# Patient Record
Sex: Male | Born: 1970 | Race: Black or African American | Hispanic: No | Marital: Married | Smoking: Never smoker
Health system: Southern US, Community
[De-identification: ages and names within clinical notes are randomized; demographics above are authoritative.]

## PROBLEM LIST (undated history)

## (undated) DIAGNOSIS — I10 Essential (primary) hypertension: Secondary | ICD-10-CM

## (undated) DIAGNOSIS — E119 Type 2 diabetes mellitus without complications: Secondary | ICD-10-CM

## (undated) DIAGNOSIS — E611 Iron deficiency: Secondary | ICD-10-CM

## (undated) DIAGNOSIS — E7800 Pure hypercholesterolemia, unspecified: Secondary | ICD-10-CM

## (undated) HISTORY — PX: ACHILLES TENDON REPAIR: SUR1153

---

## 1999-05-30 ENCOUNTER — Encounter: Payer: Self-pay | Admitting: Emergency Medicine

## 1999-05-30 ENCOUNTER — Emergency Department (HOSPITAL_COMMUNITY): Admission: EM | Admit: 1999-05-30 | Discharge: 1999-05-30 | Payer: Self-pay | Admitting: Emergency Medicine

## 2011-05-01 ENCOUNTER — Other Ambulatory Visit: Payer: Self-pay | Admitting: Orthopedic Surgery

## 2011-05-01 DIAGNOSIS — M25572 Pain in left ankle and joints of left foot: Secondary | ICD-10-CM

## 2011-05-02 ENCOUNTER — Other Ambulatory Visit: Payer: Self-pay

## 2011-05-02 ENCOUNTER — Ambulatory Visit
Admission: RE | Admit: 2011-05-02 | Discharge: 2011-05-02 | Disposition: A | Discharge status: Home / Self Care | Payer: Managed Care, Other (non HMO) | Source: Ambulatory Visit | Attending: Orthopedic Surgery | Admitting: Orthopedic Surgery

## 2011-05-02 DIAGNOSIS — M25572 Pain in left ankle and joints of left foot: Secondary | ICD-10-CM

## 2011-05-04 ENCOUNTER — Other Ambulatory Visit: Payer: Self-pay

## 2011-06-21 ENCOUNTER — Ambulatory Visit (HOSPITAL_COMMUNITY)
Admission: RE | Admit: 2011-06-21 | Discharge: 2011-06-21 | Disposition: A | Discharge status: Home / Self Care | Payer: Managed Care, Other (non HMO) | Source: Ambulatory Visit | Attending: Orthopedic Surgery | Admitting: Orthopedic Surgery

## 2011-06-21 DIAGNOSIS — M25673 Stiffness of unspecified ankle, not elsewhere classified: Secondary | ICD-10-CM | POA: Insufficient documentation

## 2011-06-21 DIAGNOSIS — M25676 Stiffness of unspecified foot, not elsewhere classified: Secondary | ICD-10-CM | POA: Insufficient documentation

## 2011-06-21 DIAGNOSIS — M25879 Other specified joint disorders, unspecified ankle and foot: Secondary | ICD-10-CM | POA: Insufficient documentation

## 2011-06-21 DIAGNOSIS — M6281 Muscle weakness (generalized): Secondary | ICD-10-CM | POA: Insufficient documentation

## 2011-06-21 DIAGNOSIS — R262 Difficulty in walking, not elsewhere classified: Secondary | ICD-10-CM | POA: Insufficient documentation

## 2011-06-21 DIAGNOSIS — IMO0001 Reserved for inherently not codable concepts without codable children: Secondary | ICD-10-CM | POA: Insufficient documentation

## 2011-06-21 DIAGNOSIS — M25579 Pain in unspecified ankle and joints of unspecified foot: Secondary | ICD-10-CM | POA: Insufficient documentation

## 2011-06-21 NOTE — Evaluation (Signed)
Physical Therapy Evaluation  Patient Details  Name: Zachary Perry MRN: [id number] Date of Birth: 07-04-1970  Today's Date: 06/21/2011 Time: 0803-0850 PT Time Calculation (min): 47 min  Visit#: 1  of 8   Re-eval: 07/21/11 Assessment Diagnosis: achilles tendon repair Surgical Date: 05/08/11 Next MD Visit: 07/30/2011 Prior Therapy: none  Authorization: [employer]  Subjective Symptoms/Limitations Symptoms: [name] states that he was playing basketball in April and went to jump when he came down he could not walk correctly.  He continued to work on the ankle for two weeks but his ankle was very swollen.  He went to urgent care who told him to use cruthces for two weeks but a week after he decided to go see an orthopedic surgeon who ordered an MRI that showed a torn tendon.  He was operated  May 7th.  He was casted  for six weeks and then placed in a cam boot.  He was to use crutches for one to two weeks but he states he has not needed to use the crutches yet.  The patient is currently being referred to therapy to improve his ROM and strentgh and return him to prior functional level. How long can you stand comfortably?: No problem  How long can you walk comfortably?: The longest he has walked with his cam boot on it 45 minutes. Patient Stated Goals: To be able to do everything he wants to do. Pain Assessment Currently in Pain?: No/denies  Precautions/Restrictions   Pt is in a cam boot.   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#, pushing a box on rollers that weighs 400# Leisure: Hobbies-yes (Comment) Comments: basketball, preaches;bowling, golfing   Cognition/Observation Cognition Overall Cognitive Status: Appears within  functional limits for tasks assessed  Sensation/Coordination/Flexibility/Functional Tests Functional Tests Functional Tests: LEFS 44/80  Assessment LLE AROM (degrees) Left Ankle Dorsiflexion: 8  Left Ankle Plantar Flexion: 42  Left Ankle Inversion: 21  Left Ankle Eversion: 10  LLE Strength Left Ankle Dorsiflexion: 4/5 Left Ankle Plantar Flexion: 4/5 Left Ankle Inversion: 4/5 Left Ankle Eversion: 4/5  Exercise/Treatments   Towel Crunch: 3 reps Ankle Exercises - Supine T-Band:  (blue t-band x 15 for all) Ankle Exercises - Sidelying    Physical Therapy Assessment and Plan PT Assessment and Plan Clinical Impression Statement: Pt s/p achilles tendon repair x 6 weeks who will benefit from skilled PT to improve proprioception, and functinal strength to allow pt to return to work and recreational [employer], mini-squat, gastroc stretch, rocker board and sitting baps for next treatment.  Third treatment add SLS, B heel raises, progress to two feet standing baps  insuring to increased pain.   PA called back verbalized that the patient may be out of the Cam boot while in therapy or in the house for short times only for the next two weeks.  After this the patient can begin weaning out of boot outside of the home as well. Goals Home Exercise Program Pt will Perform Home Exercise Program: Independently PT Short Term Goals Time to Complete Short Term Goals: 2 weeks PT Short Term Goal 1: Pt to ambulate without cam boot in the house comfortably. PT Short Term Goal 2: Pt strength improved by 1/2 grade. PT Long Term Goals Time to Complete Long Term  Goals: 4 weeks PT Long Term Goal 1: Pt to be able to be out of cam boot all day without increased pain PT Long Term Goal 2: Pt to be able to go up and down steps reciprocally Long Term Goal 3: Pt to be able to squat and lift 40# box without  difficulty Long Term Goal 4: Pt to be able to RTW PT Long Term Goal 5: Pt to be able to SLS for  60 seconds  Additional PT Long Term Goals?: Yes PT Long Term Goal 6: Pt to be able to bowl without increased pain  Problem List Patient Active Problem List  Diagnosis  . Decreased proprioception of joint of foot    PT - End of Session Activity Tolerance: Patient tolerated treatment well General Behavior During Session: [facility] for tasks performed Cognition: [facility] for tasks performed  [name],CINDY 06/21/2011, 9:27 AM  Physician Documentation Your signature is required to indicate approval of the treatment plan as stated above.  Please sign and either send electronically or make a copy of this report for your files and return this physician signed original.   Please mark one 1.__approve of plan  2. ___approve of plan with the following conditions.   ______________________________                                                          _____________________ Physician Signature                                                                                                             Date

## 2011-06-26 ENCOUNTER — Ambulatory Visit (HOSPITAL_COMMUNITY)
Admission: RE | Admit: 2011-06-26 | Discharge: 2011-06-26 | Disposition: A | Discharge status: Home / Self Care | Payer: Managed Care, Other (non HMO) | Source: Ambulatory Visit | Attending: Orthopedic Surgery | Admitting: Orthopedic Surgery

## 2011-06-26 NOTE — Progress Notes (Signed)
Physical Therapy Treatment Patient Details  Name: Zachary Perry MRN: [id number] Date of Birth: Dec 13, 1970  Today's Date: 06/26/2011 Time: 1191-4782 PT Time Calculation (min): 43 min Charges: Therex x 35'   Visit#: 2  of 8   Re-eval: 07/21/11 Authorization: [employer]    Subjective: Symptoms/Limitations Symptoms: Pt is pain free and reports HEP compiance. Pain Assessment Currently in Pain?: No/denies   Exercise/Treatments Stretches Gastroc stretch 3x30" Aerobic Exercises  Stationary Bike: 6'@3 .5 seat 6 Ankle Exercises - Standing Rocker Board: 2 minutes (R/L A/P) Other Standing Ankle Exercises: mini squat x 10 Ankle Exercises - Seated Towel Crunch: 5 reps BAPS: Level 3;Sitting;5 reps Ankle Exercises - Supine T-Band: 15x each direction x 15  Physical Therapy Assessment and Plan PT Assessment and Plan Clinical Impression Statement: Therex completed without boot per PA. Pt made aware that per PA he can ambulate in therapy and at home for short periods of time. Also, that he may begin weaning off boot in 2 weeks.  Pt completes all therex with minimal difficulty. Pt completes HEP with minimal need for cueing. Pt is without complaint throughout session. Pt reports 0/10 pain at end of session. PT Plan: Continue to progress per PT POC. Begin SLS, B heel raises, progress to two feet standing baps insuring to increased pain.      Problem List Patient Active Problem List  Diagnosis  . Decreased proprioception of joint of foot    PT - End of Session Activity Tolerance: Patient tolerated treatment well General Behavior During Session: [facility] for tasks performed Cognition: [facility] for tasks performed   Seth Bake, PTA 06/26/2011, 9:36 AM

## 2011-06-28 ENCOUNTER — Ambulatory Visit (HOSPITAL_COMMUNITY)
Admission: RE | Admit: 2011-06-28 | Discharge: 2011-06-28 | Disposition: A | Discharge status: Home / Self Care | Payer: Managed Care, Other (non HMO) | Source: Ambulatory Visit | Attending: Orthopedic Surgery | Admitting: Orthopedic Surgery

## 2011-06-28 NOTE — Progress Notes (Signed)
Physical Therapy Treatment Patient Details  Name: Zachary Perry MRN: [id number] Date of Birth: June 19, 1970  Today's Date: 06/28/2011 Time: 4098-1191 PT Time Calculation (min): 41 min  Visit#: 3  of 8   Re-eval: 06/21/11 Diagnosis: L Achilles Tendon Repair Surgical Date: 05/08/11 Next MD Visit: 07/30/2011 Prior Therapy: none Charges:  therex 35'  Subjective: Symptoms/Limitations Symptoms: Pt. states he is not having any pain currently.  States he does not have any pain when his boot is off but it will hurt a little when he first puts it back on. Pain Assessment Currently in Pain?: No/denies  Precautions/Restrictions  Precautions Precaution Comments: CAM boot until 07/04/11 but can remove in therapy  Exercise/Treatments Ankle Stretches Slant Board Stretch: 3 reps;30 seconds Aerobic Exercises Stationary Bike: 6'@3 .5 seat 6 Ankle Exercises - Standing SLS: max of 3 trials 41" Rocker Board: 2 minutes Heel Raises: 10 reps Other Standing Ankle Exercises: mini squat x 10 Ankle Exercises - Seated Towel Crunch: Limitations Towel Crunch Limitations: 2 min Towel Inversion/Eversion: Limitations;Weights Towel Inversion/Eversion Weights (lbs): 10 reps 3# each BAPS: Level 3;Sitting;10 reps Ankle Exercises - Supine T-Band: 15x each direction blue band   Physical Therapy Assessment and Plan PT Assessment and Plan Clinical Impression Statement: Pt. able to complete all exercises without diffiuculty or c/o pain.  completed 41" with SLS.  Progressing well with strength and stability. PT Plan: Begin B Standing BAPS, vector stance with 1 UE and progress to Dentist.  may d/c theraband to HEP as pt. with good form/ Independent with task.     Problem List Patient Active Problem List  Diagnosis  . Decreased proprioception of joint of foot    PT - End of Session Activity Tolerance: Patient tolerated treatment well General Behavior During Session: [facility] for tasks performed Cognition:  [facility] for tasks performed   Lurena Nida, PTA/CLT 06/28/2011, 8:50 AM

## 2011-07-03 ENCOUNTER — Ambulatory Visit (HOSPITAL_COMMUNITY)
Admission: RE | Admit: 2011-07-03 | Discharge: 2011-07-03 | Disposition: A | Discharge status: Home / Self Care | Payer: Managed Care, Other (non HMO) | Source: Ambulatory Visit | Attending: Internal Medicine | Admitting: Internal Medicine

## 2011-07-03 DIAGNOSIS — IMO0001 Reserved for inherently not codable concepts without codable children: Secondary | ICD-10-CM | POA: Insufficient documentation

## 2011-07-03 DIAGNOSIS — M25673 Stiffness of unspecified ankle, not elsewhere classified: Secondary | ICD-10-CM | POA: Insufficient documentation

## 2011-07-03 DIAGNOSIS — R262 Difficulty in walking, not elsewhere classified: Secondary | ICD-10-CM | POA: Insufficient documentation

## 2011-07-03 DIAGNOSIS — M25579 Pain in unspecified ankle and joints of unspecified foot: Secondary | ICD-10-CM | POA: Insufficient documentation

## 2011-07-03 DIAGNOSIS — M6281 Muscle weakness (generalized): Secondary | ICD-10-CM | POA: Insufficient documentation

## 2011-07-03 DIAGNOSIS — M25676 Stiffness of unspecified foot, not elsewhere classified: Secondary | ICD-10-CM | POA: Insufficient documentation

## 2011-07-03 NOTE — Progress Notes (Signed)
Physical Therapy Treatment Patient Details  Name: Zachary Perry MRN: [id number] Date of Birth: 01-Sep-1970  Today's Date: 07/03/2011 Time: 0801-0848 PT Time Calculation (min): 47 min  Visit#: 4  of 8   Re-eval: 06/21/11 Charges: Therex x 39'  Subjective: Symptoms/Limitations Symptoms: Pt reports that he is completing HEP everyday. Pain Assessment Currently in Pain?: No/denies   Exercise/Treatments Ankle Stretches Slant Board Stretch: 3 reps;30 seconds Aerobic Exercises Stationary Bike: 6'@3 .5 seat 6 Machines for Strengthening Cybex Leg Press: 2pl x 10 Ankle Exercises - Standing SLS: 1' Rocker Board: 2 minutes;Limitations Rocker Board Limitations: A/P R/L Heel Raises: 20 reps Other Standing Ankle Exercises: mini squat x 15 Ankle Exercises - Seated Towel Inversion/Eversion: Weights Towel Inversion/Eversion Weights (lbs): 10 x 5# BAPS: Level 3;Sitting;15 reps  Physical Therapy Assessment and Plan PT Assessment and Plan Clinical Impression Statement: Pt ambulates into therapy without CAM boot without difficulty. Pt completes all exercises with minimal difficulty. Began SLS with vectors to progress proprioceptive control. Also began PF on leg press to improve gastroc strength. Pt is without complaint throughout session. PT Plan: Continue per PT POC. Begin B Standing BAPS progress to Dentist.     Problem List Patient Active Problem List  Diagnosis  . Decreased proprioception of joint of foot    PT - End of Session Activity Tolerance: Patient tolerated treatment well General Behavior During Session: [facility] for tasks performed Cognition: [facility] for tasks performed   Seth Bake, PTA 07/03/2011, 9:34 AM

## 2011-07-04 ENCOUNTER — Ambulatory Visit (HOSPITAL_COMMUNITY)
Admission: RE | Admit: 2011-07-04 | Discharge: 2011-07-04 | Disposition: A | Discharge status: Home / Self Care | Payer: Managed Care, Other (non HMO) | Source: Ambulatory Visit | Attending: Internal Medicine | Admitting: Internal Medicine

## 2011-07-04 NOTE — Progress Notes (Signed)
Physical Therapy Treatment Patient Details  Name: Zachary Perry MRN: [id number] Date of Birth: [birth year]  Today's Date: 07/04/2011 Time: 0801-0848 PT Time Calculation (min): 47 min  Visit#: 5  of 8   Re-eval: 07/21/11 Charges: Therex x 10' NMR x 30'   Subjective: Symptoms/Limitations Symptoms: Pt states that he has not worn his CAM boot since Sunday. Pt is without complaint. Pain Assessment Currently in Pain?: No/denies   Exercise/Treatments Ankle Stretches Slant Board Stretch: 2 reps;60 seconds Aerobic Exercises Stationary Bike: 6'@3 .5 seat 6 Machines for Strengthening Cybex Leg Press: 2.5x15 Ankle Exercises - Standing BAPS: Level 3;10 reps;Standing;Limitations BAPS Limitations: B LE Rocker Board: 2 minutes;Limitations Rocker Board Limitations: A/P R/L (D/C R/L) Balance Master: Limits for Stability: Test Balance Master: Static: Test Heel Raises: 10 reps;Limitations Heel Raises Limitations: with L eccentric lowering Heel Walk (Round Trip): 2RT Toe Walk (Round Trip): 2RT (Pt unable to maintain PF on LLE) Other Standing Ankle Exercises: mini squat x 15  Physical Therapy Assessment and Plan PT Assessment and Plan Clinical Impression Statement: Progressed proprioceptive exercises with minimal difficulty. Pt is without complaint throughout session. Attempted toe walking but gastroc is not strong enough to maintain PF on LLE. Began heel raise with L eccentric lower to improve gastroc strength. Pt reports 0/10 pain at end of session.  PT Plan: Continue per PT POC.     Problem List Patient Active Problem List  Diagnosis  . Decreased proprioception of joint of foot    PT - End of Session Activity Tolerance: Patient tolerated treatment well General Behavior During Session: [facility] for tasks performed Cognition: [facility] for tasks performed  Seth Bake, PTA 07/04/2011, 8:57 AM

## 2011-07-10 ENCOUNTER — Ambulatory Visit (HOSPITAL_COMMUNITY)
Admission: RE | Admit: 2011-07-10 | Discharge: 2011-07-10 | Disposition: A | Discharge status: Home / Self Care | Payer: Managed Care, Other (non HMO) | Source: Ambulatory Visit | Attending: Internal Medicine | Admitting: Internal Medicine

## 2011-07-10 NOTE — Progress Notes (Signed)
Physical Therapy Treatment Patient Details  Name: Zachary Perry MRN: [id number] Date of Birth: 03-Jul-1970  Today's Date: 07/10/2011 Time: 0802-0843 PT Time Calculation (min): 41 min Charges:  therex 40' Visit#: 6  of 8   Re-eval: 07/20/11    Subjective: Symptoms/Limitations Symptoms: Pt. states he is stiff this morning but no pain reported. Pain Assessment Currently in Pain?: No/denies   Exercise/Treatments Ankle Stretches Slant Board Stretch: 2 reps;60 seconds Aerobic Exercises Stationary Bike: 6'@3 .5 seat 12 Machines for Strengthening Cybex Leg Press: 2.5  2x15 Ankle Exercises - Standing BAPS: Level 4;Standing;Limitations;10 reps BAPS Limitations: B LE Rocker Board: 2 minutes;Limitations Rocker Board Limitations: A/P R/L Rebounder: Red 20 reps L only Balance Master: Limits for Stability: 1:06 L only with B UE assist Balance Master: Static: 1' L only with 1 UE assist Heel Raises: 10 reps;Limitations Heel Raises Limitations: with L eccentric lowering Heel Walk (Round Trip): 2RT Toe Walk (Round Trip): 2RT (Difficult to maintain on R toe) Other Standing Ankle Exercises: mini squat x 15   Physical Therapy Assessment and Plan PT Assessment and Plan Clinical Impression Statement: Increasing ROM through bike and gastroc stretch.  Toe walking continues to be difficult for pt. due to weak eccentric control.  Early fatigue with balance master DS with 1 UE assist but able to increase to L4 on the BAPS in standing.  Added rebounder on L UE only with good control. PT Plan: Continue per POC.     Problem List Patient Active Problem List  Diagnosis  . Decreased proprioception of joint of foot    PT - End of Session Activity Tolerance: Patient tolerated treatment well General Behavior During Session: [facility] for tasks performed Cognition: [facility] for tasks performed   Lurena Nida, PTA/CLT 07/10/2011, 8:47 AM

## 2011-07-12 ENCOUNTER — Ambulatory Visit (HOSPITAL_COMMUNITY)
Admission: RE | Admit: 2011-07-12 | Discharge: 2011-07-12 | Disposition: A | Discharge status: Home / Self Care | Payer: Managed Care, Other (non HMO) | Source: Ambulatory Visit | Attending: Internal Medicine | Admitting: Internal Medicine

## 2011-07-12 NOTE — Progress Notes (Signed)
Physical Therapy Treatment Patient Details  Name: Zachary Perry MRN: [id number] Date of Birth: December 21, 1970  Today's Date: 07/12/2011 Time: 0801-0847 PT Time Calculation (min): 46 min Visit#: 7  of 8   Re-eval: 07/20/11 Charges:  therex 40'    Subjective: Symptoms/Limitations Symptoms: Pt. reports he walked 2 miles yesterday.  Continues to be painfree. Pain Assessment Currently in Pain?: No/denies    Exercise/Treatments Ankle Stretches Slant Board Stretch: 2 reps;60 seconds Aerobic Exercises Stationary Bike: 6'@3 .5 seat 12 Elliptical: add next visit Machines for Strengthening Cybex Leg Press: 3 PL   2x15 Ankle Exercises - Standing BAPS: Level 4;Standing;Limitations;15 reps BAPS Limitations: L with R toe down Rocker Board: 2 minutes;Limitations Rocker Board Limitations: A/P R/L Rebounder: Blue 20 reps L only Balance Master: Limits for Stability: 1:19 L only, 1 HHA Balance Master: Static: 1'X 2 bouts L only with 1 UE assist Heel Raises: 15 reps Heel Raises Limitations: with L eccentric lowering Heel Walk (Round Trip): 2RT Toe Walk (Round Trip): 2RT (Difficulty to maintain L toe) Other Standing Ankle Exercises: mini squat x 15   Physical Therapy Assessment and Plan PT Assessment and Plan Clinical Impression Statement: Progressed weight on gastroc press and increased to yellow ball with rebounder without difficulty.  Eccentric strength continues to be most difficult for pt.  Able to complete full minute on balance master DS without resting due to fatigue.  Progressing well. PT Plan: Re-evaluate next visit.  Switch from bike to elliptical.   Problem List Patient Active Problem List  Diagnosis  . Decreased proprioception of joint of foot    PT - End of Session Activity Tolerance: Patient tolerated treatment well General Behavior During Session: [facility] for tasks performed Cognition: [facility] for tasks performed   Lurena Nida, PTA/CLT 07/12/2011, 8:54 AM

## 2011-07-17 ENCOUNTER — Ambulatory Visit (HOSPITAL_COMMUNITY)
Admission: RE | Admit: 2011-07-17 | Discharge: 2011-07-17 | Disposition: A | Discharge status: Home / Self Care | Payer: Managed Care, Other (non HMO) | Source: Ambulatory Visit | Attending: Internal Medicine | Admitting: Internal Medicine

## 2011-07-17 NOTE — Progress Notes (Signed)
Physical Therapy Treatment Patient Details  Name: Zachary Perry MRN: [id number] Date of Birth: April 06, 1970  Today's Date: 07/17/2011 Time: 0802-0846 PT Time Calculation (min): 44 min  Visit#: 8  of 8   Re-eval: 07/20/11 Charges: Therex x 38'  Subjective: Symptoms/Limitations Symptoms: Pt states he tried to run this weekend and had not pain until afterwards. Pt rates that pain at a 3/10. No pain currently. Pain Assessment Currently in Pain?: No/denies   Exercise/Treatments Ankle Stretches Slant Board Stretch: 3 reps;30 seconds Aerobic Exercises Stationary Bike: 6'@3 .5 seat 12 Machines for Strengthening Cybex Leg Press: 3 PL 2x15 Ankle Exercises - Standing BAPS: Level 4;Standing;Limitations;15 reps BAPS Limitations: L with R toe down Rocker Board: 2 minutes;Limitations Rocker Board Limitations: A/P R/L Rebounder: Blue 20 reps L only Balance Master: Limits for Stability: 1:24 L only, HHA as needed Balance Master: Static: 1'X 2 bouts L only w/o UE assist Heel Raises: 15 reps Heel Raises Limitations: with L eccentric lowering Heel Walk (Round Trip): 3RT Toe Walk (Round Trip): 3RT (Difficult to maintain L toe) Other Standing Ankle Exercises: mini squat x 20 Ankle Exercises - Seated Other Seated Ankle Exercises: PF with knee ext cybex 5.5 pl x 15  Physical Therapy Assessment and Plan PT Assessment and Plan Clinical Impression Statement: Pt states that he attempted to run this weekend. Pt advised to consult MD about returning to running. Pt states that he does not intend to run again anytime soon. Pt completes all therex well and is without complaint throughout session. Pt reports 0/10 pain at end of session. PT Plan: Re-evaluate next visit.     Problem List Patient Active Problem List  Diagnosis  . Decreased proprioception of joint of foot    PT - End of Session Activity Tolerance: Patient tolerated treatment well General Behavior During Session: [facility] for tasks  performed Cognition: [facility] for tasks performed   Seth Bake, PTA 07/17/2011, 8:47 AM

## 2011-07-19 ENCOUNTER — Ambulatory Visit (HOSPITAL_COMMUNITY)
Admission: RE | Admit: 2011-07-19 | Discharge: 2011-07-19 | Disposition: A | Discharge status: Home / Self Care | Payer: Managed Care, Other (non HMO) | Source: Ambulatory Visit | Attending: Internal Medicine | Admitting: Internal Medicine

## 2011-07-19 DIAGNOSIS — M25879 Other specified joint disorders, unspecified ankle and foot: Secondary | ICD-10-CM

## 2011-07-19 NOTE — Evaluation (Signed)
Physical Therapy Evaluation  Patient Details  Name: Zachary Perry MRN: [id number] Date of Birth: 07-Sep-1970  Today's Date: 07/19/2011 Time: 0802-0838 PT Time Calculation (min): 36 min  Visit#: 9  of 9   Re-eval:   Assessment Diagnosis: L Achilles Tendon Repair Surgical Date: 05/08/11 Next MD Visit: 07/30/2011 Prior Therapy: none   Past Medical History: No past medical history on file. Past Surgical History: No past surgical history on file.  Subjective Symptoms/Limitations Symptoms: My leg feels great but I still need to build my calf up.  The Pt states he swelled about 1/2 mile and he noted increased pain and swelling.   How long can you walk comfortably?: The patient is no longer using his cam boot and is walking an hour at a time. Patient Stated Goals: The patient is using the push mower/ he is not jogging yet. Pain Assessment Currently in Pain?: No/denies  Functional Tests Functional Tests:  (LEFS was 44 now 63/80)  Assessment LLE AROM (degrees) Left Ankle Dorsiflexion: 18  (was 8) Left Ankle Plantar Flexion: 42  Left Ankle Inversion: 30  (was 21) Left Ankle Eversion: 15  (was 10) LLE Strength Left Ankle Dorsiflexion:  (4+/5 was 4/5) Left Ankle Plantar Flexion: 3/5 (was rated 4/5 but this was isometrically; 3/5 is standing he) Left Ankle Inversion: 5/5 (was 4/5) Left Ankle Eversion: 5/5 (was 4/5) Palpation Palpation: Pt has hypertrophic scarring at distal end of gastroc.    Exercise/Treatments Lift 40# wt from 12" and from floor without difficulty.    Physical Therapy Assessment and Plan PT Assessment and Plan Clinical Impression Statement: Pt has progressed well but still unable to do a single heel raise.  Pt understands HEP for strengthening as well as scar massage.  Pt will attempt to work on RTW activities on his own for the next week and report to MD how this is progressing Rehab Potential: Good PT Plan: D/C    Goals Home Exercise Program PT Goal:  Perform Home Exercise Program - Progress: Met PT Short Term Goals PT Short Term Goal 1 - Progress: Met PT Short Term Goal 2: Gastroc mm is stronger although mm muscle test does not show this to be true.  The reason for this is gastroc was tested isometrically upon initla eval, with standing heellift on re-eval. PT Short Term Goal 2 - Progress: Met PT Long Term Goals PT Long Term Goal 1 - Progress: Met PT Long Term Goal 2 - Progress: Met Long Term Goal 3 Progress: Met Long Term Goal 4 Progress: Not met (Not released from MD yet.)  Problem List Patient Active Problem List  Diagnosis  . Decreased proprioception of joint of foot    PT - End of Session Activity Tolerance: Patient tolerated treatment well General Behavior During Session: [facility] for tasks performed Cognition: [facility] for tasks performed  GP    [place] Delong,CINDY 07/19/2011, 8:41 AM  Physician Documentation Your signature is required to indicate approval of the treatment plan as stated above.  Please sign and either send electronically or make a copy of this report for your files and return this physician signed original.   Please mark one 1.__approve of plan  2. ___approve of plan with the following conditions.   ______________________________  _____________________ Physician Signature                                                                                                             Date

## 2018-09-30 ENCOUNTER — Other Ambulatory Visit: Payer: Self-pay | Admitting: *Deleted

## 2018-09-30 DIAGNOSIS — Z20822 Contact with and (suspected) exposure to covid-19: Secondary | ICD-10-CM

## 2018-10-01 LAB — NOVEL CORONAVIRUS, NAA: SARS-CoV-2, NAA: DETECTED — AB

## 2019-03-05 ENCOUNTER — Ambulatory Visit: Payer: 59 | Attending: Internal Medicine

## 2019-03-05 ENCOUNTER — Other Ambulatory Visit (HOSPITAL_BASED_OUTPATIENT_CLINIC_OR_DEPARTMENT_OTHER): Payer: Self-pay

## 2019-03-05 DIAGNOSIS — R0683 Snoring: Secondary | ICD-10-CM

## 2019-03-05 DIAGNOSIS — Z23 Encounter for immunization: Secondary | ICD-10-CM | POA: Insufficient documentation

## 2019-03-05 NOTE — Progress Notes (Signed)
   Covid-19 Vaccination Clinic  Name:  TALVIN CHRISTIANSON    MRN: [id number] DOB: 1970/06/24  03/05/2019  Mr. Patchell was observed post Covid-19 immunization for 15 minutes without incident. He was provided with Vaccine Information Sheet and instruction to access the V-Safe system.   Mr. Campos was instructed to call 911 with any severe reactions post vaccine: Marland Kitchen Difficulty breathing  . Swelling of face and throat  . A fast heartbeat  . A bad rash all over body  . Dizziness and weakness   Immunizations Administered    Name Date Dose VIS Date Route   Moderna COVID-19 Vaccine 03/05/2019  9:11 AM 0.5 mL 12/02/2018 Intramuscular   Manufacturer: Moderna   Lot: [id number]   NDC: 30856-943-70

## 2019-03-11 ENCOUNTER — Ambulatory Visit: Payer: 59 | Attending: Internal Medicine | Admitting: Neurology

## 2019-03-11 ENCOUNTER — Other Ambulatory Visit: Payer: Self-pay

## 2019-03-11 DIAGNOSIS — R0683 Snoring: Secondary | ICD-10-CM | POA: Diagnosis present

## 2019-03-16 NOTE — Procedures (Signed)
  [facility] Chauncey Bruno A. Gerilyn Pilgrim, MD     [email or web address]             HOME SLEEP STUDY  LOCATION: [facility]  Patient Name: Silas, Muff Date: 03/12/2019 Gender: Male D.O.B: [birth year] Age (years): 25 Referring Provider: Jamison Oka MD Height (inches): 70 Interpreting Physician: Beryle Beams MD, ABSM Weight (lbs): 263 RPSGT: Peak, Robert BMI: 38 MRN: [id number] Neck Size: CLINICAL INFORMATION Sleep Study Type: HST     Indication for sleep study: Snoring     Epworth Sleepiness Score: NA  SLEEP STUDY TECHNIQUE A multi-channel overnight portable sleep study was performed. The channels recorded were: nasal airflow, thoracic respiratory movement, and oxygen saturation with a pulse oximetry. Snoring was also monitored.  MEDICATIONS Patient self administered medications include: N/A. No current outpatient medications on file.  SLEEP ARCHITECTURE Patient was studied for 413.1 minutes. The sleep efficiency was 98.0 % and the patient was supine for 1.8%. The arousal index was 0.0 per hour.  RESPIRATORY PARAMETERS The overall AHI was 1.9 per hour, with a central apnea index of 0.0 per hour.  The oxygen nadir was 86% during sleep.     CARDIAC DATA Mean heart rate during sleep was 75.0 bpm.  IMPRESSIONS No significant obstructive sleep apnea occurred during this study (AHI = 1.9/h). No significant central sleep apnea occurred during this study (CAI = 0.0/h).   Argie Ramming, MD Diplomate, American Board of Sleep Medicine.  ELECTRONICALLY SIGNED ON:  03/16/2019, 12:29 PM [facility] PH: [phone]   FX: [phone] ACCREDITED BY THE AMERICAN ACADEMY OF SLEEP MEDICINE

## 2019-04-07 ENCOUNTER — Ambulatory Visit: Payer: 59 | Attending: Internal Medicine

## 2019-04-07 DIAGNOSIS — Z23 Encounter for immunization: Secondary | ICD-10-CM

## 2019-04-07 NOTE — Progress Notes (Signed)
   Covid-19 Vaccination Clinic  Name:  Zachary Perry    MRN: [id number] DOB: 1970/09/02  04/07/2019  [name] was observed post Covid-19 immunization for 15 minutes without incident. He was provided with Vaccine Information Sheet and instruction to access the V-Safe system.   [name] was instructed to call 911 with any severe reactions post vaccine: Marland Kitchen Difficulty breathing  . Swelling of face and throat  . A fast heartbeat  . A bad rash all over body  . Dizziness and weakness   Immunizations Administered    Name Date Dose VIS Date Route   Moderna COVID-19 Vaccine 04/07/2019  8:29 AM 0.5 mL 12/02/2018 Intramuscular   Manufacturer: Moderna   Lot: [id number]   NDC: 33295-188-41

## 2019-06-04 ENCOUNTER — Other Ambulatory Visit: Payer: Self-pay

## 2019-06-04 ENCOUNTER — Emergency Department (HOSPITAL_COMMUNITY)
Admission: EM | Admit: 2019-06-04 | Discharge: 2019-06-04 | Disposition: A | Discharge status: Home / Self Care | Payer: 59 | Attending: Emergency Medicine | Admitting: Emergency Medicine

## 2019-06-04 ENCOUNTER — Emergency Department (HOSPITAL_COMMUNITY): Payer: 59

## 2019-06-04 ENCOUNTER — Encounter (HOSPITAL_COMMUNITY): Payer: Self-pay

## 2019-06-04 ENCOUNTER — Emergency Department (HOSPITAL_COMMUNITY): Admission: EM | Admit: 2019-06-04 | Payer: 59 | Source: Home / Self Care

## 2019-06-04 DIAGNOSIS — I10 Essential (primary) hypertension: Secondary | ICD-10-CM | POA: Insufficient documentation

## 2019-06-04 DIAGNOSIS — E119 Type 2 diabetes mellitus without complications: Secondary | ICD-10-CM | POA: Diagnosis not present

## 2019-06-04 DIAGNOSIS — R42 Dizziness and giddiness: Secondary | ICD-10-CM | POA: Insufficient documentation

## 2019-06-04 HISTORY — DX: Essential (primary) hypertension: I10

## 2019-06-04 HISTORY — DX: Type 2 diabetes mellitus without complications: E11.9

## 2019-06-04 HISTORY — DX: Iron deficiency: E61.1

## 2019-06-04 HISTORY — DX: Pure hypercholesterolemia, unspecified: E78.00

## 2019-06-04 LAB — CBC WITH DIFFERENTIAL/PLATELET
Abs Immature Granulocytes: 0.01 10*3/uL (ref 0.00–0.07)
Basophils Absolute: 0 10*3/uL (ref 0.0–0.1)
Basophils Relative: 1 %
Eosinophils Absolute: 0.2 10*3/uL (ref 0.0–0.5)
Eosinophils Relative: 4 %
HCT: 42 % (ref 39.0–52.0)
Hemoglobin: 13.6 g/dL (ref 13.0–17.0)
Immature Granulocytes: 0 %
Lymphocytes Relative: 39 %
Lymphs Abs: 2 10*3/uL (ref 0.7–4.0)
MCH: 29.2 pg (ref 26.0–34.0)
MCHC: 32.4 g/dL (ref 30.0–36.0)
MCV: 90.1 fL (ref 80.0–100.0)
Monocytes Absolute: 0.3 10*3/uL (ref 0.1–1.0)
Monocytes Relative: 5 %
Neutro Abs: 2.6 10*3/uL (ref 1.7–7.7)
Neutrophils Relative %: 51 %
Platelets: 295 10*3/uL (ref 150–400)
RBC: 4.66 MIL/uL (ref 4.22–5.81)
RDW: 13.6 % (ref 11.5–15.5)
WBC: 5.2 10*3/uL (ref 4.0–10.5)
nRBC: 0 % (ref 0.0–0.2)

## 2019-06-04 LAB — BASIC METABOLIC PANEL
Anion gap: 9 (ref 5–15)
BUN: 12 mg/dL (ref 6–20)
CO2: 27 mmol/L (ref 22–32)
Calcium: 8.8 mg/dL — ABNORMAL LOW (ref 8.9–10.3)
Chloride: 101 mmol/L (ref 98–111)
Creatinine, Ser: 1.02 mg/dL (ref 0.61–1.24)
GFR calc Af Amer: >60 mL/min (ref >60)
GFR calc non Af Amer: >60 mL/min (ref >60)
Glucose, Bld: 168 mg/dL — ABNORMAL HIGH (ref 70–99)
Potassium: 3.6 mmol/L (ref 3.5–5.1)
Sodium: 137 mmol/L (ref 135–145)

## 2019-06-04 MED ORDER — SODIUM CHLORIDE 0.9 % IV BOLUS
1000.0000 mL | Freq: Once | INTRAVENOUS | Status: AC
  Administered 2019-06-04: 1000 mL via INTRAVENOUS

## 2019-06-04 MED ORDER — MECLIZINE HCL 12.5 MG PO TABS
50.0000 mg | ORAL_TABLET | Freq: Once | ORAL | Status: AC
  Administered 2019-06-04: 50 mg via ORAL
  Filled 2019-06-04: qty 4

## 2019-06-04 MED ORDER — ONDANSETRON HCL 4 MG/2ML IJ SOLN
4.0000 mg | Freq: Once | INTRAMUSCULAR | Status: AC
  Administered 2019-06-04: 4 mg via INTRAVENOUS
  Filled 2019-06-04: qty 2

## 2019-06-04 NOTE — ED Notes (Signed)
Pt ambulatory to and from restroom with steady gait 

## 2019-06-04 NOTE — ED Provider Notes (Signed)
4:15 PM-he arrives from [facility] for repeat MRI imaging.  He continues to have dizziness which manifests as difficulty walking.  Onset of symptoms after awakening this morning.  He came here by private vehicle for repeat testing at the advice of neuro hospitalist who is aware his status.  Currently patient is comfortable, denies headache, chest pain, weakness or paresthesia.  He ambulates with a normal gait, walking to the bathroom.  6:40 PM-discussed repeat MRI results with neuro hospitalist, Dr. Otelia Limes.  He is reassured and states that no further neurologic interventions required at this time.  Relative to the MRI abnormality showing the hyperintensity in the left petrous bone, he recommends discussion with ENT.  I had a brief discussion with Dr. Elijah Birk, ENT physician on call.  We reviewed the MRI results and the clinical exam together.  He recommends that the patient follow-up with a neuro otologist, as needed for further evaluation and treatment of the abnormal MRI showing a petrous apex hyperintensity.  6:50 PM-no change in clinical status, I discussed the repeat MRI images with the patient.  He states he will follow-up with his PCP for ongoing management of dizziness, and I recommend he use Zyrtec for possible seasonal allergies initiating the dizziness.  Patient will also talk to his PCP regarding the abnormal MRI images of the left petrous bone.  His PCP can arrange neurootologist follow-up as needed.   Mancel Bale, MD 06/04/19 [phone]

## 2019-06-04 NOTE — ED Triage Notes (Signed)
Pt is a transfer from [facility] ED for repeat MRI, pt having persistent dizziness. Pt a.o

## 2019-06-04 NOTE — ED Notes (Signed)
Report called Clydie Braun, CN at [facility] ED

## 2019-06-04 NOTE — ED Notes (Signed)
Patient verbalizes understanding of discharge instructions. Opportunity for questioning and answers were provided. Pt discharged from ED. 

## 2019-06-04 NOTE — ED Triage Notes (Signed)
Pt reports dizziness off and on x 3 days.  Reports dizziness worse with movement.  Reports dry heaved this morning. Denies any pain.  CBG 139 this morning.  Pt vomited in triage.  Pt says he was dizzy again when he woke up and was diaphoretic.

## 2019-06-04 NOTE — Discharge Instructions (Addendum)
The repeat MRI, done at this hospital, did not show a stroke, to explain your dizziness.  Your dizziness may be related to allergies, which could improve by taking a daily antihistamine such as Zyrtec.  Also make sure you are drinking plenty of fluids, getting plenty of rest and eating a regular diet.  The MRI showed a abnormality of the left petrous bone.  This is nonspecific and may need to be evaluated by a Neuro-Otologist.  This type of physician practices at a tertiary care Medical Center such as [facility], in [place], [place] or [facility] in [place].  Your primary care doctor can arrange for referral to a physician in one of the centers.  It would be a good idea to start with talking with him about the MRI results and then make a decision on how to proceed with care.  These results and images would be available to your PCP.  Today, the radiologist report says:  Redemonstrated asymmetric T1, T2 and FLAIR hyperintensity within the  left petrous apex which may reflect asymmetric marrow or trapped  fluid.

## 2019-06-04 NOTE — ED Notes (Signed)
Pt transported to MRI 

## 2019-06-04 NOTE — ED Notes (Signed)
Pt understands to go to [facility] ED right away.  PIV in place and site WNL.

## 2019-06-04 NOTE — ED Provider Notes (Signed)
[facility] Emergency Department Provider Note MRN:  [id number]  Arrival date & time: 06/04/19     Chief Complaint   Dizziness   History of Present Illness   Zachary Perry is a 49 y.o. year-old male with a history of diabetes, hypertension presenting to the ED with chief complaint of dizziness.  Dizziness described as a lightheadedness and/or wooziness, worse when bending down, worse when standing up.  Has been going on and off for 3 days.  Associated with some dry heaving this morning.  Denies chest pain, no abdominal pain, no shortness of breath, no leg pain or swelling.  No numbness or weakness to the arms or legs, no vomiting or diarrhea.  States that he has not been drinking as much water as he normally does.  Review of Systems  A complete 10 system review of systems was obtained and all systems are negative except as noted in the HPI and PMH.   Patient's Health History    Past Medical History:  Diagnosis Date  . Diabetes mellitus without complication (HCC)   . Hypercholesterolemia   . Hypertension   . Low iron     Past Surgical History:  Procedure Laterality Date  . ACHILLES TENDON REPAIR      No family history on file.  Social History   Socioeconomic History  . Marital status: Married    Spouse name: Not on file  . Number of children: Not on file  . Years of education: Not on file  . Highest education level: Not on file  Occupational History  . Not on file  Tobacco Use  . Smoking status: Never Smoker  . Smokeless tobacco: Never Used  Substance and Sexual Activity  . Alcohol use: Never  . Drug use: Never  . Sexual activity: Not on file  Other Topics Concern  . Not on file  Social History Narrative  . Not on file   Social Determinants of Health   Financial Resource Strain:   . Difficulty of Paying Living Expenses:   Food Insecurity:   . Worried About Programme researcher, broadcasting/film/video in the Last Year:   . Barista in the Last Year:     Transportation Needs:   . Freight forwarder (Medical):   Marland Kitchen Lack of Transportation (Non-Medical):   Physical Activity:   . Days of Exercise per Week:   . Minutes of Exercise per Session:   Stress:   . Feeling of Stress :   Social Connections:   . Frequency of Communication with Friends and Family:   . Frequency of Social Gatherings with Friends and Family:   . Attends Religious Services:   . Active Member of Clubs or Organizations:   . Attends Banker Meetings:   Marland Kitchen Marital Status:   Intimate Partner Violence:   . Fear of Current or Ex-Partner:   . Emotionally Abused:   Marland Kitchen Physically Abused:   . Sexually Abused:      Physical Exam   Vitals:   06/04/19 1146 06/04/19 1152  BP: 132/88   Pulse: (!) 51 (!) 58  Resp: (!) 22 (!) 21  SpO2: 100% 100%    CONSTITUTIONAL: Well-appearing, NAD NEURO:  Alert and oriented x 3, normal and symmetric strength and sensation, normal coordination, normal speech, no neglect, no aphasia, slightly unsteady gait EYES:  eyes equal and reactive ENT/NECK:  no LAD, no JVD CARDIO: Regular rate, well-perfused, normal S1 and S2 PULM:  CTAB no wheezing  or rhonchi GI/GU:  normal bowel sounds, non-distended, non-tender MSK/SPINE:  No gross deformities, no edema SKIN:  no rash, atraumatic PSYCH:  Appropriate speech and behavior  *Additional and/or pertinent findings included in MDM below  Diagnostic and Interventional Summary    EKG Interpretation  Date/Time:  Thursday June 04 2019 09:09:01 EDT Ventricular Rate:  71 PR Interval:    QRS Duration: 116 QT Interval:  392 QTC Calculation: 426 R Axis:   78 Text Interpretation: Sinus rhythm Nonspecific intraventricular conduction delay No previous ECGs available Confirmed by Gerlene Fee [phone]) on 06/04/2019 9:46:07 AM      Labs Reviewed  BASIC METABOLIC PANEL - Abnormal; Notable for the following components:      Result Value   Glucose, Bld 168 (*)    Calcium 8.8 (*)    All  other components within normal limits  CBC WITH DIFFERENTIAL/PLATELET    MR BRAIN WO CONTRAST  Final Result      Medications  sodium chloride 0.9 % bolus 1,000 mL (1,000 mLs Intravenous New Bag/Given 06/04/19 1021)  ondansetron (ZOFRAN) injection 4 mg (4 mg Intravenous Given 06/04/19 1020)  meclizine (ANTIVERT) tablet 50 mg (50 mg Oral Given 06/04/19 1020)     Procedures  /  Critical Care Procedures  ED Course and Medical Decision Making  I have reviewed the triage vital signs, the nursing notes, and pertinent available records from the EMR.  Listed above are laboratory and imaging tests that I personally ordered, reviewed, and interpreted and then considered in my medical decision making (see below for details).      Dizziness described more as a lightheadedness, reassuring vital signs, well-appearing on exam, thorough neurological exam is reassuring, no nystagmus, doubt vertigo, specifically doubt central vertigo.  Will reevaluate after fluids and symptomatic management.  Patient is having persistent dizziness despite fluids, work-up is reassuring, he is not dehydrated, he is making plenty of urine here.  Decision was made to obtain MRI to exclude posterior circulation stroke.  MRI is revealing a punctate abnormality to the midbrain.  This finding was discussed with Dr. Cheral Marker of neurology.  Based on the MRI and the patient's ABCD two score, there is basically a 50-50 chance that this finding is a stroke.  Dr. Cheral Marker recommending ED to ED transfer for repeat MRI at [facility] on a different MRI machine to determine disposition.  Accepting ED physician Dr. Ashok Cordia.  Patient prefers private vehicle transfer, wife will drive him.  Advised to go straight there.  Barth Kirks. Sedonia Small, MD [place] [email or web address]  Final Clinical Impressions(s) / ED Diagnoses     ICD-10-CM   1. Dizziness  R42     ED Discharge Orders    None        Discharge Instructions Discussed with and Provided to Patient:     Discharge Instructions     Please go directly to [facility] emergency department for repeat MRI.       Maudie Flakes, MD 06/04/19 1409

## 2019-06-22 ENCOUNTER — Other Ambulatory Visit: Payer: Self-pay | Admitting: Internal Medicine

## 2019-06-22 DIAGNOSIS — R9389 Abnormal findings on diagnostic imaging of other specified body structures: Secondary | ICD-10-CM

## 2019-08-03 ENCOUNTER — Other Ambulatory Visit: Payer: Self-pay

## 2019-08-03 ENCOUNTER — Ambulatory Visit
Admission: RE | Admit: 2019-08-03 | Discharge: 2019-08-03 | Disposition: A | Discharge status: Home / Self Care | Payer: 59 | Source: Ambulatory Visit | Attending: Internal Medicine | Admitting: Internal Medicine

## 2019-08-03 DIAGNOSIS — R9389 Abnormal findings on diagnostic imaging of other specified body structures: Secondary | ICD-10-CM

## 2019-08-03 MED ORDER — GADOBENATE DIMEGLUMINE 529 MG/ML IV SOLN
20.0000 mL | Freq: Once | INTRAVENOUS | Status: AC | PRN
  Administered 2019-08-03: 20 mL via INTRAVENOUS

## 2021-09-19 DIAGNOSIS — N179 Acute kidney failure, unspecified: Secondary | ICD-10-CM | POA: Diagnosis not present

## 2021-10-16 DIAGNOSIS — Z23 Encounter for immunization: Secondary | ICD-10-CM | POA: Diagnosis not present

## 2021-10-16 DIAGNOSIS — I10 Essential (primary) hypertension: Secondary | ICD-10-CM | POA: Diagnosis not present

## 2021-10-16 DIAGNOSIS — Z6836 Body mass index (BMI) 36.0-36.9, adult: Secondary | ICD-10-CM | POA: Diagnosis not present

## 2021-10-16 DIAGNOSIS — E1165 Type 2 diabetes mellitus with hyperglycemia: Secondary | ICD-10-CM | POA: Diagnosis not present

## 2021-10-16 DIAGNOSIS — K219 Gastro-esophageal reflux disease without esophagitis: Secondary | ICD-10-CM | POA: Diagnosis not present

## 2021-11-16 DIAGNOSIS — E1165 Type 2 diabetes mellitus with hyperglycemia: Secondary | ICD-10-CM | POA: Diagnosis not present

## 2021-11-16 DIAGNOSIS — E785 Hyperlipidemia, unspecified: Secondary | ICD-10-CM | POA: Diagnosis not present

## 2021-11-16 DIAGNOSIS — K219 Gastro-esophageal reflux disease without esophagitis: Secondary | ICD-10-CM | POA: Diagnosis not present

## 2021-12-05 IMAGING — MR MR HEAD W/O CM
6 of 11 series · 24 of 48 positions shown · non-contrast
Comparison: Brain MRI 06/04/2019

CLINICAL DATA: Focal neuro deficit, greater than 6 hours, stroke
suspected.

EXAM:
MRI HEAD WITHOUT CONTRAST
TECHNIQUE: Multiplanar, multiecho pulse sequences of the brain and surrounding
structures were obtained without intravenous contrast.

[Series 2: DWI · axial · 3.0mm · 0.94mm/px · z∈[-66,+89]mm · 7 of 106 slices shown (1 of 2)]
[im 1/106]
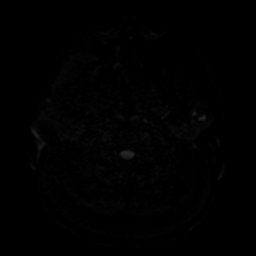
[im 18/106]
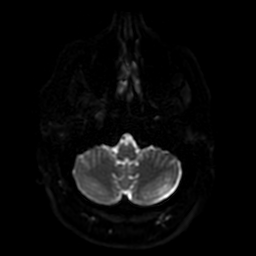
[im 36/106]
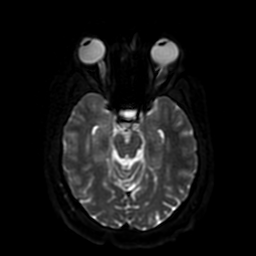
[im 53/106]
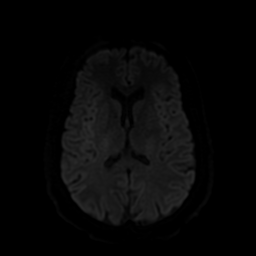
[im 71/106]
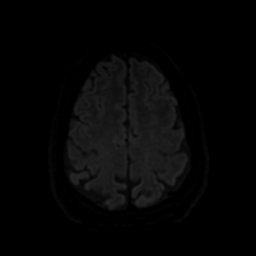
[im 88/106]
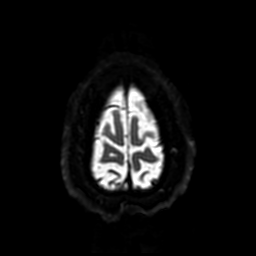
[im 106/106]
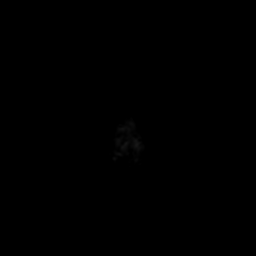

[Series 3: DWI · coronal · 4.0mm · 0.94mm/px · 6 of 76 slices shown (2 of 2)]
[im 1/76]
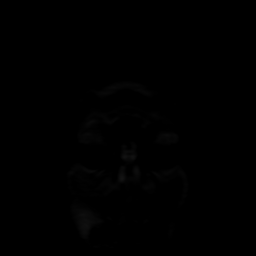
[im 16/76]
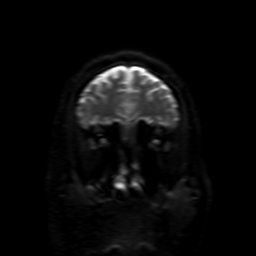
[im 31/76]
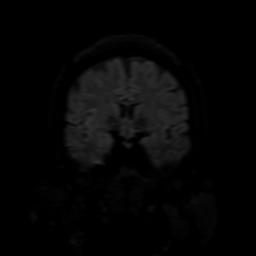
[im 46/76]
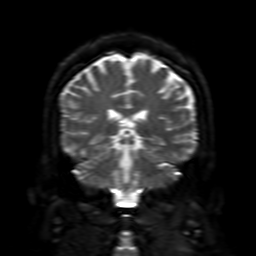
[im 61/76]
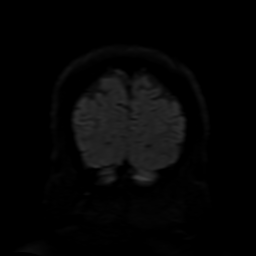
[im 76/76]
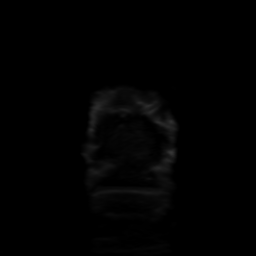

[Series 4: FLAIR · sagittal · 5.0mm · 0.23mm/px · 2 of 26 slices shown (1 of 2)]
[im 1/26]
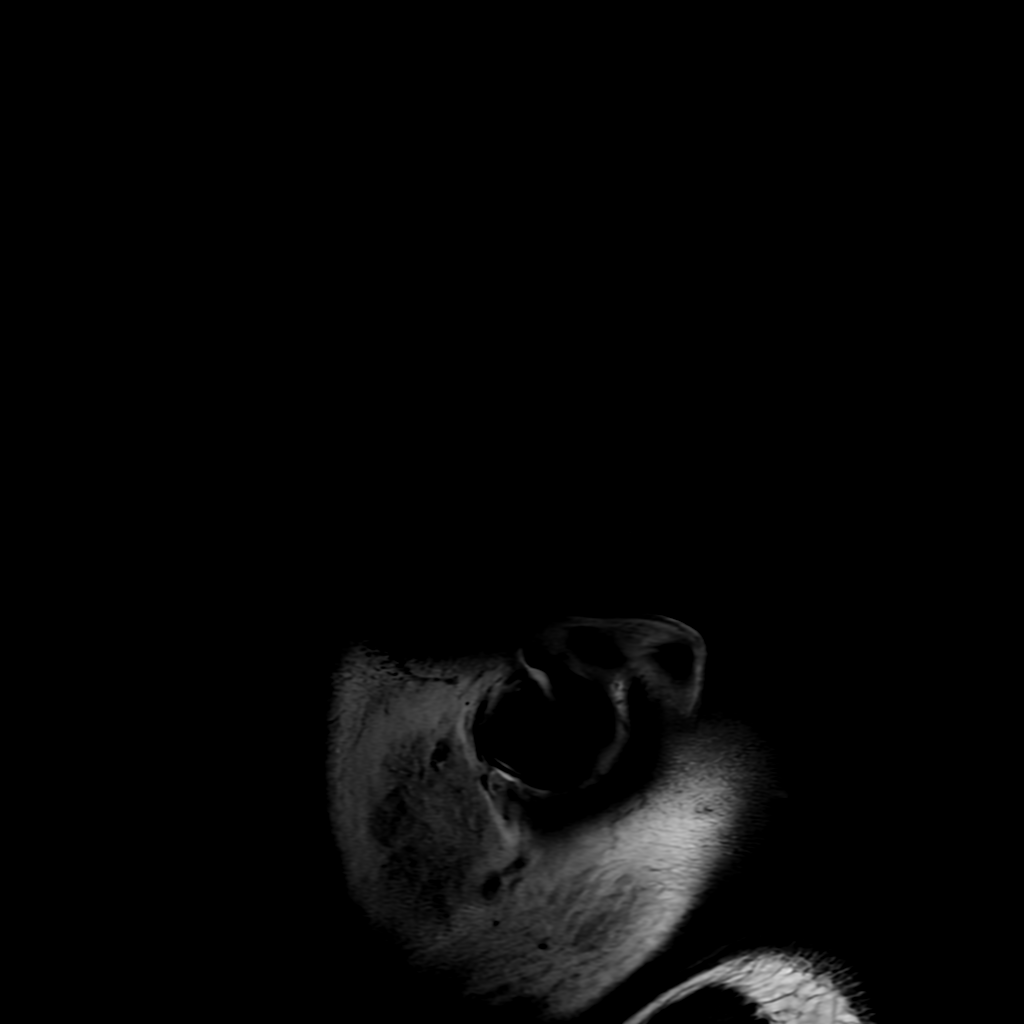
[im 26/26]
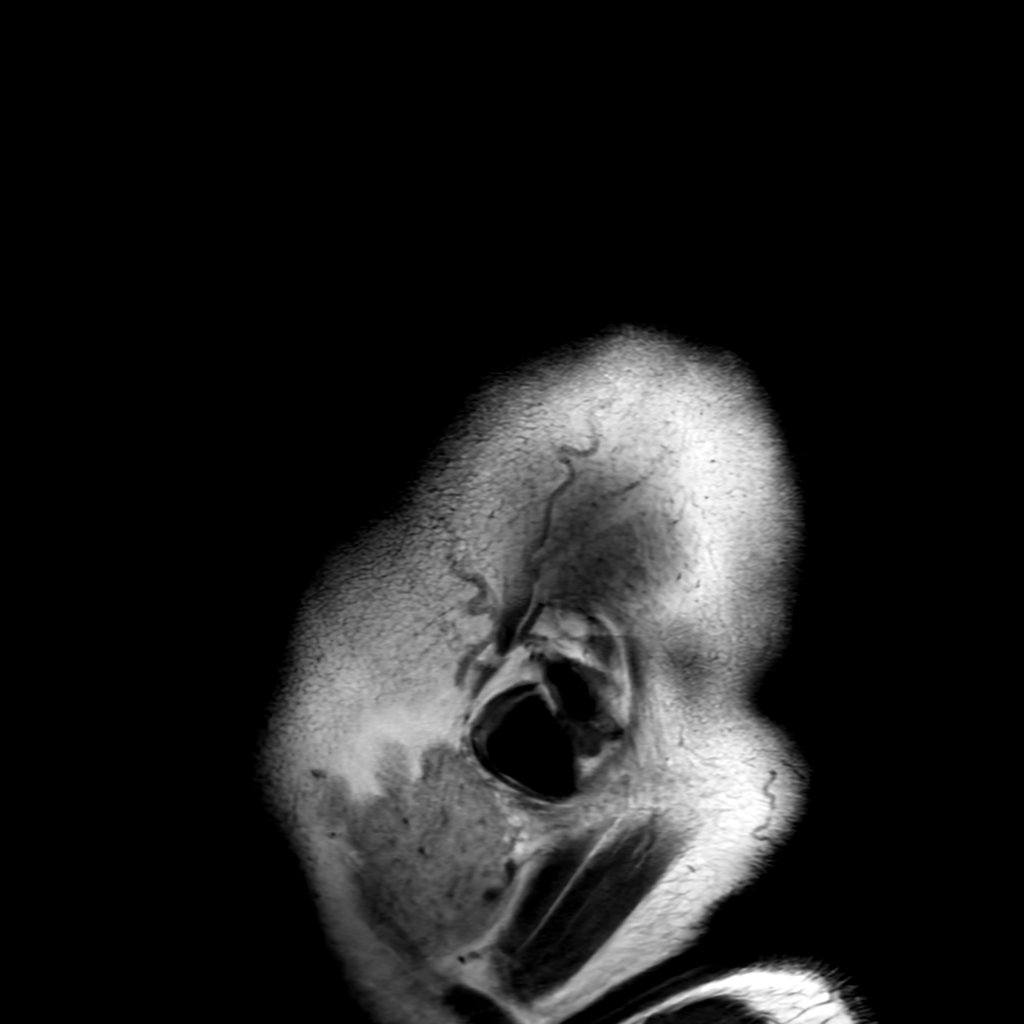

[Series 6: FLAIR · axial · 3.0mm · 0.45mm/px · z∈[-65,+84]mm · 2 of 26 slices shown (2 of 2)]
[im 1/26]
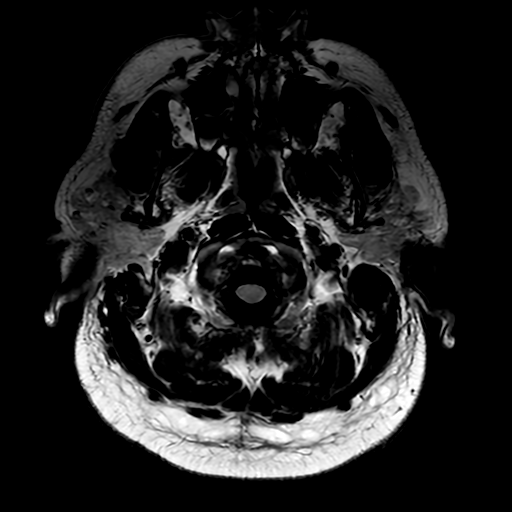
[im 26/26]
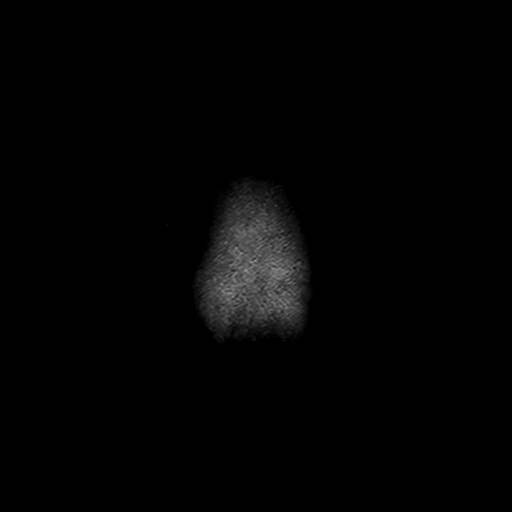

[Series 250: ADC · axial · 3.0mm · 0.94mm/px · z∈[-66,+89]mm · 4 of 51 slices shown (1 of 2)]
[im 1/51]
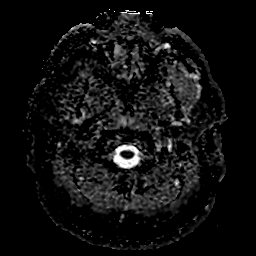
[im 17/51]
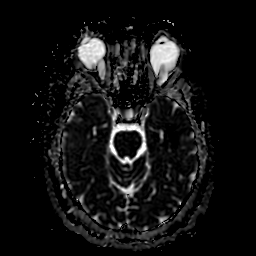
[im 34/51]
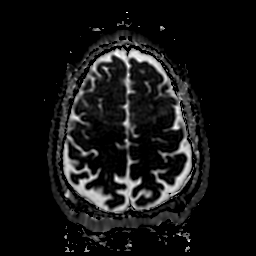
[im 51/51]
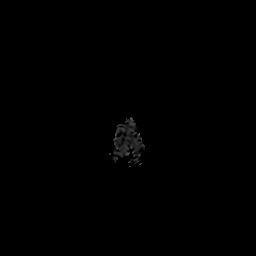

[Series 350: ADC · coronal · 4.0mm · 0.94mm/px · 3 of 38 slices shown (2 of 2)]
[im 1/38]
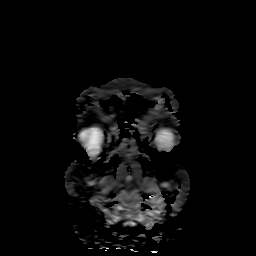
[im 19/38]
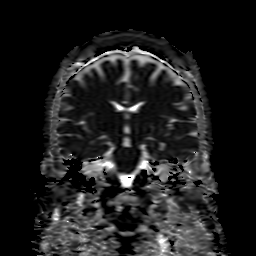
[im 38/38]
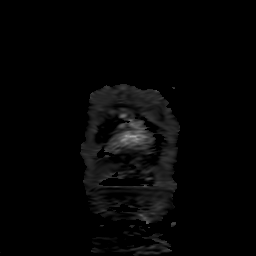

[24 of 48 positions shown; findings below may reference images not displayed]

FINDINGS: Brain:

The punctate focus of restricted diffusion previously questioned
within the right midbrain is not appreciated on the current exam. No
evidence of acute infarction.

There are a few punctate scattered foci of T2/FLAIR hyperintensity
within the cerebral white matter which are nonspecific, but
consistent with minimal chronic small vessel ischemic disease.

No evidence of intracranial mass.

No chronic intracranial blood products.

No extra-axial fluid collection.

No midline shift.

Redemonstrated asymmetric T1, T2 and FLAIR hyperintensity within the
left petrous apex which may reflect asymmetric marrow or trapped
fluid.

Vascular: Expected proximal arterial flow voids.

Skull and upper cervical spine: No focal suspicious marrow lesion.

Sinuses/Orbits: Visualized orbits show no acute finding. Mild
ethmoid and maxillary sinus mucosal thickening. Small right
maxillary sinus mucous retention cyst. No significant mastoid
effusion.
IMPRESSION: The punctate focus of restricted diffusion previously questioned
within the right midbrain is not appreciated. There is no evidence
of acute infarction.

Minimal chronic small vessel ischemic changes within the cerebral
white matter.

Redemonstrated asymmetric T1, T2 and FLAIR hyperintensity within the
left petrous apex which may reflect asymmetric marrow or trapped
fluid.

Mild ethmoid and maxillary sinus mucosal thickening with small right
maxillary sinus mucous retention cysts.

## 2021-12-07 DIAGNOSIS — Z6836 Body mass index (BMI) 36.0-36.9, adult: Secondary | ICD-10-CM | POA: Diagnosis not present

## 2021-12-07 DIAGNOSIS — E1165 Type 2 diabetes mellitus with hyperglycemia: Secondary | ICD-10-CM | POA: Diagnosis not present

## 2021-12-07 DIAGNOSIS — I10 Essential (primary) hypertension: Secondary | ICD-10-CM | POA: Diagnosis not present

## 2022-04-13 ENCOUNTER — Encounter (HOSPITAL_BASED_OUTPATIENT_CLINIC_OR_DEPARTMENT_OTHER): Payer: Self-pay

## 2022-04-13 ENCOUNTER — Other Ambulatory Visit (HOSPITAL_BASED_OUTPATIENT_CLINIC_OR_DEPARTMENT_OTHER): Payer: Self-pay

## 2022-04-13 MED ORDER — MOUNJARO 15 MG/0.5ML ~~LOC~~ SOAJ
15.0000 mg | SUBCUTANEOUS | 2 refills | Status: DC
  Filled 2022-04-13: qty 2, 28d supply, fill #0
  Filled 2022-05-30: qty 2, 28d supply, fill #1
  Filled 2022-07-06: qty 2, 28d supply, fill #2

## 2022-04-16 ENCOUNTER — Other Ambulatory Visit (HOSPITAL_BASED_OUTPATIENT_CLINIC_OR_DEPARTMENT_OTHER): Payer: Self-pay

## 2022-05-01 ENCOUNTER — Other Ambulatory Visit (HOSPITAL_BASED_OUTPATIENT_CLINIC_OR_DEPARTMENT_OTHER): Payer: Self-pay

## 2022-05-08 ENCOUNTER — Other Ambulatory Visit (HOSPITAL_BASED_OUTPATIENT_CLINIC_OR_DEPARTMENT_OTHER): Payer: Self-pay

## 2022-05-08 MED ORDER — MOUNJARO 15 MG/0.5ML ~~LOC~~ SOAJ
15.0000 mg | SUBCUTANEOUS | 0 refills | Status: AC
  Filled 2022-05-08: qty 2, 28d supply, fill #0

## 2022-05-30 ENCOUNTER — Other Ambulatory Visit (HOSPITAL_BASED_OUTPATIENT_CLINIC_OR_DEPARTMENT_OTHER): Payer: Self-pay

## 2022-06-08 ENCOUNTER — Other Ambulatory Visit (HOSPITAL_BASED_OUTPATIENT_CLINIC_OR_DEPARTMENT_OTHER): Payer: Self-pay

## 2022-07-07 ENCOUNTER — Other Ambulatory Visit (HOSPITAL_BASED_OUTPATIENT_CLINIC_OR_DEPARTMENT_OTHER): Payer: Self-pay

## 2022-08-07 ENCOUNTER — Other Ambulatory Visit (HOSPITAL_BASED_OUTPATIENT_CLINIC_OR_DEPARTMENT_OTHER): Payer: Self-pay

## 2022-08-07 MED ORDER — MOUNJARO 15 MG/0.5ML ~~LOC~~ SOAJ
15.0000 mg | SUBCUTANEOUS | 1 refills | Status: DC
  Filled 2022-08-07 – 2022-08-10 (×2): qty 2, 28d supply, fill #0
  Filled 2022-09-01 – 2022-09-20 (×4): qty 2, 28d supply, fill #1

## 2022-08-10 ENCOUNTER — Other Ambulatory Visit (HOSPITAL_BASED_OUTPATIENT_CLINIC_OR_DEPARTMENT_OTHER): Payer: Self-pay

## 2022-08-11 ENCOUNTER — Other Ambulatory Visit (HOSPITAL_BASED_OUTPATIENT_CLINIC_OR_DEPARTMENT_OTHER): Payer: Self-pay

## 2022-09-04 ENCOUNTER — Other Ambulatory Visit: Payer: Self-pay

## 2022-09-06 ENCOUNTER — Other Ambulatory Visit (HOSPITAL_BASED_OUTPATIENT_CLINIC_OR_DEPARTMENT_OTHER): Payer: Self-pay

## 2022-09-07 ENCOUNTER — Other Ambulatory Visit (HOSPITAL_BASED_OUTPATIENT_CLINIC_OR_DEPARTMENT_OTHER): Payer: Self-pay

## 2022-09-12 ENCOUNTER — Other Ambulatory Visit (HOSPITAL_BASED_OUTPATIENT_CLINIC_OR_DEPARTMENT_OTHER): Payer: Self-pay

## 2022-09-24 ENCOUNTER — Other Ambulatory Visit (HOSPITAL_BASED_OUTPATIENT_CLINIC_OR_DEPARTMENT_OTHER): Payer: Self-pay

## 2022-09-24 MED ORDER — INFLUENZA VIRUS VACC SPLIT PF (FLUZONE) 0.5 ML IM SUSY
0.5000 mL | PREFILLED_SYRINGE | Freq: Once | INTRAMUSCULAR | 0 refills | Status: AC
  Filled 2022-09-24: qty 0.5, 1d supply, fill #0

## 2022-10-05 LAB — AMB RESULTS CONSOLE CBG: Glucose: 82

## 2022-10-05 NOTE — Progress Notes (Signed)
SDOH declined

## 2022-10-30 ENCOUNTER — Encounter: Payer: Self-pay | Admitting: *Deleted

## 2022-10-31 NOTE — Progress Notes (Signed)
Pt attended [phone] screening event where his b/p was 170/90 and his blood sugar was 82. At the event, the pt confirmed his PCP is Dr. Jamison Oka from [facility] and did not identify any SDOH needs. Chart review indicates pt last saw Dr. Corky Downs on 07/23/22 where his b/p was 117/80 and he has a future appt with Dr. Corky Downs on 11/26/22. During the f/u call to the pt, he shared he has his own b/p cuff, given to him by the [place] and that his home b/p was been approx 140/84 most days. He states both the [place] and Dr. Corky Downs have him exercising and give him dietary advice and usually recheck his b/p at each visit to verify best results. He confirmed he takes his b/p regularly and has no SDOH barriers. No additional health equity team support indicated at this time.

## 2022-11-01 ENCOUNTER — Other Ambulatory Visit (HOSPITAL_BASED_OUTPATIENT_CLINIC_OR_DEPARTMENT_OTHER): Payer: Self-pay

## 2022-11-07 ENCOUNTER — Other Ambulatory Visit (HOSPITAL_BASED_OUTPATIENT_CLINIC_OR_DEPARTMENT_OTHER): Payer: Self-pay

## 2022-11-08 ENCOUNTER — Other Ambulatory Visit (HOSPITAL_BASED_OUTPATIENT_CLINIC_OR_DEPARTMENT_OTHER): Payer: Self-pay

## 2022-11-08 MED ORDER — MOUNJARO 15 MG/0.5ML ~~LOC~~ SOAJ
15.0000 mg | SUBCUTANEOUS | 1 refills | Status: AC
  Filled 2022-11-08: qty 2, 28d supply, fill #0
  Filled 2022-11-29: qty 2, 28d supply, fill #1

## 2022-11-13 ENCOUNTER — Other Ambulatory Visit (HOSPITAL_BASED_OUTPATIENT_CLINIC_OR_DEPARTMENT_OTHER): Payer: Self-pay

## 2022-11-13 MED ORDER — MOUNJARO 15 MG/0.5ML ~~LOC~~ SOAJ
15.0000 mg | SUBCUTANEOUS | 1 refills | Status: AC
  Filled 2022-11-13 – 2022-12-21 (×24): qty 2, 28d supply, fill #0
  Filled 2023-01-11 – 2023-01-23 (×2): qty 2, 28d supply, fill #1

## 2022-11-13 MED ORDER — MOUNJARO 15 MG/0.5ML ~~LOC~~ SOAJ
15.0000 mg | SUBCUTANEOUS | 1 refills | Status: AC
  Filled 2022-11-13: qty 2, 28d supply, fill #0

## 2022-11-14 ENCOUNTER — Other Ambulatory Visit (HOSPITAL_BASED_OUTPATIENT_CLINIC_OR_DEPARTMENT_OTHER): Payer: Self-pay

## 2022-11-15 ENCOUNTER — Other Ambulatory Visit (HOSPITAL_BASED_OUTPATIENT_CLINIC_OR_DEPARTMENT_OTHER): Payer: Self-pay

## 2022-11-16 ENCOUNTER — Other Ambulatory Visit (HOSPITAL_BASED_OUTPATIENT_CLINIC_OR_DEPARTMENT_OTHER): Payer: Self-pay

## 2022-11-17 ENCOUNTER — Other Ambulatory Visit (HOSPITAL_BASED_OUTPATIENT_CLINIC_OR_DEPARTMENT_OTHER): Payer: Self-pay

## 2022-11-19 ENCOUNTER — Other Ambulatory Visit (HOSPITAL_BASED_OUTPATIENT_CLINIC_OR_DEPARTMENT_OTHER): Payer: Self-pay

## 2022-11-20 ENCOUNTER — Other Ambulatory Visit: Payer: Self-pay

## 2022-11-21 ENCOUNTER — Other Ambulatory Visit (HOSPITAL_BASED_OUTPATIENT_CLINIC_OR_DEPARTMENT_OTHER): Payer: Self-pay

## 2022-11-22 ENCOUNTER — Other Ambulatory Visit (HOSPITAL_BASED_OUTPATIENT_CLINIC_OR_DEPARTMENT_OTHER): Payer: Self-pay

## 2022-11-23 ENCOUNTER — Other Ambulatory Visit (HOSPITAL_BASED_OUTPATIENT_CLINIC_OR_DEPARTMENT_OTHER): Payer: Self-pay

## 2022-11-24 ENCOUNTER — Other Ambulatory Visit (HOSPITAL_BASED_OUTPATIENT_CLINIC_OR_DEPARTMENT_OTHER): Payer: Self-pay

## 2022-11-26 ENCOUNTER — Other Ambulatory Visit (HOSPITAL_BASED_OUTPATIENT_CLINIC_OR_DEPARTMENT_OTHER): Payer: Self-pay

## 2022-11-26 MED ORDER — MOUNJARO 15 MG/0.5ML ~~LOC~~ SOAJ
15.0000 mg | SUBCUTANEOUS | 1 refills | Status: AC
  Filled 2022-11-26 – 2022-12-29 (×5): qty 2, 28d supply, fill #0

## 2022-11-30 ENCOUNTER — Other Ambulatory Visit (HOSPITAL_BASED_OUTPATIENT_CLINIC_OR_DEPARTMENT_OTHER): Payer: Self-pay

## 2022-12-01 ENCOUNTER — Other Ambulatory Visit (HOSPITAL_BASED_OUTPATIENT_CLINIC_OR_DEPARTMENT_OTHER): Payer: Self-pay

## 2022-12-03 ENCOUNTER — Other Ambulatory Visit: Payer: Self-pay

## 2022-12-04 ENCOUNTER — Other Ambulatory Visit: Payer: Self-pay

## 2022-12-04 ENCOUNTER — Other Ambulatory Visit (HOSPITAL_BASED_OUTPATIENT_CLINIC_OR_DEPARTMENT_OTHER): Payer: Self-pay

## 2022-12-05 ENCOUNTER — Other Ambulatory Visit (HOSPITAL_BASED_OUTPATIENT_CLINIC_OR_DEPARTMENT_OTHER): Payer: Self-pay

## 2022-12-06 ENCOUNTER — Other Ambulatory Visit (HOSPITAL_BASED_OUTPATIENT_CLINIC_OR_DEPARTMENT_OTHER): Payer: Self-pay

## 2022-12-07 ENCOUNTER — Other Ambulatory Visit (HOSPITAL_BASED_OUTPATIENT_CLINIC_OR_DEPARTMENT_OTHER): Payer: Self-pay

## 2022-12-08 ENCOUNTER — Other Ambulatory Visit (HOSPITAL_BASED_OUTPATIENT_CLINIC_OR_DEPARTMENT_OTHER): Payer: Self-pay

## 2022-12-10 ENCOUNTER — Other Ambulatory Visit (HOSPITAL_BASED_OUTPATIENT_CLINIC_OR_DEPARTMENT_OTHER): Payer: Self-pay

## 2022-12-11 ENCOUNTER — Other Ambulatory Visit: Payer: Self-pay

## 2022-12-12 ENCOUNTER — Other Ambulatory Visit (HOSPITAL_BASED_OUTPATIENT_CLINIC_OR_DEPARTMENT_OTHER): Payer: Self-pay

## 2022-12-20 ENCOUNTER — Other Ambulatory Visit (HOSPITAL_BASED_OUTPATIENT_CLINIC_OR_DEPARTMENT_OTHER): Payer: Self-pay

## 2022-12-21 ENCOUNTER — Other Ambulatory Visit (HOSPITAL_BASED_OUTPATIENT_CLINIC_OR_DEPARTMENT_OTHER): Payer: Self-pay

## 2022-12-21 ENCOUNTER — Other Ambulatory Visit: Payer: Self-pay

## 2022-12-27 ENCOUNTER — Other Ambulatory Visit (HOSPITAL_BASED_OUTPATIENT_CLINIC_OR_DEPARTMENT_OTHER): Payer: Self-pay

## 2022-12-28 ENCOUNTER — Other Ambulatory Visit (HOSPITAL_BASED_OUTPATIENT_CLINIC_OR_DEPARTMENT_OTHER): Payer: Self-pay

## 2022-12-29 ENCOUNTER — Other Ambulatory Visit (HOSPITAL_BASED_OUTPATIENT_CLINIC_OR_DEPARTMENT_OTHER): Payer: Self-pay

## 2023-01-11 ENCOUNTER — Other Ambulatory Visit (HOSPITAL_BASED_OUTPATIENT_CLINIC_OR_DEPARTMENT_OTHER): Payer: Self-pay

## 2023-01-17 ENCOUNTER — Other Ambulatory Visit (HOSPITAL_BASED_OUTPATIENT_CLINIC_OR_DEPARTMENT_OTHER): Payer: Self-pay

## 2023-01-22 ENCOUNTER — Other Ambulatory Visit (HOSPITAL_BASED_OUTPATIENT_CLINIC_OR_DEPARTMENT_OTHER): Payer: Self-pay

## 2023-01-23 ENCOUNTER — Other Ambulatory Visit (HOSPITAL_BASED_OUTPATIENT_CLINIC_OR_DEPARTMENT_OTHER): Payer: Self-pay

## 2023-09-03 ENCOUNTER — Other Ambulatory Visit (HOSPITAL_COMMUNITY): Payer: Self-pay | Admitting: Physician Assistant

## 2023-09-03 ENCOUNTER — Encounter: Payer: Self-pay | Admitting: Physician Assistant

## 2023-09-03 DIAGNOSIS — M79672 Pain in left foot: Secondary | ICD-10-CM

## 2023-09-08 ENCOUNTER — Ambulatory Visit (HOSPITAL_COMMUNITY)
Admission: RE | Admit: 2023-09-08 | Discharge: 2023-09-08 | Disposition: A | Discharge status: Home / Self Care | Source: Ambulatory Visit | Attending: Physician Assistant | Admitting: Physician Assistant

## 2023-09-08 DIAGNOSIS — M79672 Pain in left foot: Secondary | ICD-10-CM | POA: Diagnosis present
